# Patient Record
Sex: Female | Born: 1975 | Race: Black or African American | Hispanic: No | Marital: Single | Smoking: Never smoker
Health system: Southern US, Community
[De-identification: ages and names within clinical notes are randomized; demographics above are authoritative.]

## PROBLEM LIST (undated history)

## (undated) DIAGNOSIS — E119 Type 2 diabetes mellitus without complications: Secondary | ICD-10-CM

---

## 2014-01-21 ENCOUNTER — Encounter (HOSPITAL_COMMUNITY): Payer: Self-pay | Admitting: Emergency Medicine

## 2014-01-21 ENCOUNTER — Emergency Department (HOSPITAL_COMMUNITY)
Admission: EM | Admit: 2014-01-21 | Discharge: 2014-01-21 | Disposition: A | Discharge status: Home / Self Care | Payer: Medicaid Other | Attending: Emergency Medicine | Admitting: Emergency Medicine

## 2014-01-21 DIAGNOSIS — Z79899 Other long term (current) drug therapy: Secondary | ICD-10-CM | POA: Insufficient documentation

## 2014-01-21 DIAGNOSIS — Z880 Allergy status to penicillin: Secondary | ICD-10-CM | POA: Insufficient documentation

## 2014-01-21 DIAGNOSIS — E119 Type 2 diabetes mellitus without complications: Secondary | ICD-10-CM | POA: Insufficient documentation

## 2014-01-21 DIAGNOSIS — N390 Urinary tract infection, site not specified: Secondary | ICD-10-CM | POA: Insufficient documentation

## 2014-01-21 DIAGNOSIS — R739 Hyperglycemia, unspecified: Secondary | ICD-10-CM

## 2014-01-21 HISTORY — DX: Type 2 diabetes mellitus without complications: E11.9

## 2014-01-21 LAB — BASIC METABOLIC PANEL
Anion gap: 12 (ref 5–15)
BUN: 7 mg/dL (ref 6–23)
CO2: 24 mEq/L (ref 19–32)
Calcium: 9.2 mg/dL (ref 8.4–10.5)
Chloride: 100 mEq/L (ref 96–112)
Creatinine, Ser: 0.57 mg/dL (ref 0.50–1.10)
GLUCOSE: 304 mg/dL — AB (ref 70–99)
Potassium: 4 mEq/L (ref 3.7–5.3)
Sodium: 136 mEq/L — ABNORMAL LOW (ref 137–147)

## 2014-01-21 LAB — CBG MONITORING, ED
GLUCOSE-CAPILLARY: 297 mg/dL — AB (ref 70–99)
Glucose-Capillary: 277 mg/dL — ABNORMAL HIGH (ref 70–99)

## 2014-01-21 LAB — CBC WITH DIFFERENTIAL/PLATELET
BASOS PCT: 0 % (ref 0–1)
Basophils Absolute: 0 10*3/uL (ref 0.0–0.1)
EOS ABS: 0.1 10*3/uL (ref 0.0–0.7)
EOS PCT: 2 % (ref 0–5)
HCT: 37.7 % (ref 36.0–46.0)
HEMOGLOBIN: 12.5 g/dL (ref 12.0–15.0)
Lymphocytes Relative: 34 % (ref 12–46)
Lymphs Abs: 2.3 10*3/uL (ref 0.7–4.0)
MCH: 25 pg — AB (ref 26.0–34.0)
MCHC: 33.2 g/dL (ref 30.0–36.0)
MCV: 75.2 fL — AB (ref 78.0–100.0)
MONOS PCT: 7 % (ref 3–12)
Monocytes Absolute: 0.5 10*3/uL (ref 0.1–1.0)
Neutro Abs: 4 10*3/uL (ref 1.7–7.7)
Neutrophils Relative %: 57 % (ref 43–77)
PLATELETS: 286 10*3/uL (ref 150–400)
RBC: 5.01 MIL/uL (ref 3.87–5.11)
RDW: 13.4 % (ref 11.5–15.5)
WBC: 6.9 10*3/uL (ref 4.0–10.5)

## 2014-01-21 LAB — URINE MICROSCOPIC-ADD ON

## 2014-01-21 LAB — URINALYSIS, ROUTINE W REFLEX MICROSCOPIC
BILIRUBIN URINE: NEGATIVE
HGB URINE DIPSTICK: NEGATIVE
KETONES UR: 15 mg/dL — AB
Nitrite: NEGATIVE
PROTEIN: NEGATIVE mg/dL
Specific Gravity, Urine: 1.016 (ref 1.005–1.030)
UROBILINOGEN UA: 0.2 mg/dL (ref 0.0–1.0)
pH: 5.5 (ref 5.0–8.0)

## 2014-01-21 MED ORDER — METFORMIN HCL 500 MG PO TABS
500.0000 mg | ORAL_TABLET | Freq: Once | ORAL | Status: AC
  Administered 2014-01-21: 500 mg via ORAL
  Filled 2014-01-21: qty 1

## 2014-01-21 MED ORDER — BLOOD GLUCOSE METER KIT: PACK | Status: AC

## 2014-01-21 MED ORDER — SODIUM CHLORIDE 0.9 % IV BOLUS (SEPSIS)
1000.0000 mL | Freq: Once | INTRAVENOUS | Status: AC
  Administered 2014-01-21: 1000 mL via INTRAVENOUS

## 2014-01-21 MED ORDER — CIPROFLOXACIN HCL 500 MG PO TABS
500.0000 mg | ORAL_TABLET | Freq: Once | ORAL | Status: AC
  Administered 2014-01-21: 500 mg via ORAL
  Filled 2014-01-21: qty 1

## 2014-01-21 MED ORDER — METFORMIN HCL 500 MG PO TABS: 500.0000 mg | ORAL_TABLET | Freq: Two times a day (BID) | ORAL | Status: DC

## 2014-01-21 MED ORDER — CIPROFLOXACIN HCL 500 MG PO TABS: 500.0000 mg | ORAL_TABLET | Freq: Two times a day (BID) | ORAL | Status: AC

## 2014-01-21 NOTE — ED Notes (Signed)
Pt alert, NAD, calm, interactive, resps e/u, speaking in clear complete sentences, VSS.  

## 2014-01-21 NOTE — ED Notes (Signed)
C/o continued HA, feet itching and pins & needles in bilateral feet. denies nausea or other sx at this time. Alert, NAD, calm, interactive. No changes. VSS. Using phone.

## 2014-01-21 NOTE — ED Notes (Signed)
C/o numbness in her feet and ankles

## 2014-01-21 NOTE — Discharge Instructions (Signed)
Please start on medications as prescribed.  Contact a local primary care doctor for further evaluation and treatment of your diabetes. Return to the ER for worsening condition or new concerning symptoms.   Blood Glucose Monitoring Monitoring your blood glucose (also know as blood sugar) helps you to manage your diabetes. It also helps you and your health care provider monitor your diabetes and determine how well your treatment plan is working. WHY SHOULD YOU MONITOR YOUR BLOOD GLUCOSE?  It can help you understand how food, exercise, and medicine affect your blood glucose.  It allows you to know what your blood glucose is at any given moment. You can quickly tell if you are having low blood glucose (hypoglycemia) or high blood glucose (hyperglycemia).  It can help you and your health care provider know how to adjust your medicines.  It can help you understand how to manage an illness or adjust medicine for exercise. WHEN SHOULD YOU TEST? Your health care provider will help you decide how often you should check your blood glucose. This may depend on the type of diabetes you have, your diabetes control, or the types of medicines you are taking. Be sure to write down all of your blood glucose readings so that this information can be reviewed with your health care provider. See below for examples of testing times that your health care provider may suggest. Type 1 Diabetes  Test 4 times a day if you are in good control, using an insulin pump, or perform multiple daily injections.  If your diabetes is not well controlled or if you are sick, you may need to monitor more often.  It is a good idea to also monitor:  Before and after exercise.  Between meals and 2 hours after a meal.  Occasionally between 2:00 a.m. and 3:00 a.m. Type 2 Diabetes  It can vary with each person, but generally, if you are on insulin, test 4 times a day.  If you take medicines by mouth (orally), test 2 times a  day.  If you are on a controlled diet, test once a day.  If your diabetes is not well controlled or if you are sick, you may need to monitor more often. HOW TO MONITOR YOUR BLOOD GLUCOSE Supplies Needed  Blood glucose meter.  Test strips for your meter. Each meter has its own strips. You must use the strips that go with your own meter.  A pricking needle (lancet).  A device that holds the lancet (lancing device).  A journal or log book to write down your results. Procedure  Wash your hands with soap and water. Alcohol is not preferred.  Prick the side of your finger (not the tip) with the lancet.  Gently milk the finger until a small drop of blood appears.  Follow the instructions that come with your meter for inserting the test strip, applying blood to the strip, and using your blood glucose meter. Other Areas to Get Blood for Testing Some meters allow you to use other areas of your body (other than your finger) to test your blood. These areas are called alternative sites. The most common alternative sites are:  The forearm.  The thigh.  The back area of the lower leg.  The palm of the hand. The blood flow in these areas is slower. Therefore, the blood glucose values you get may be delayed, and the numbers are different from what you would get from your fingers. Do not use alternative sites if you think  you are having hypoglycemia. Your reading will not be accurate. Always use a finger if you are having hypoglycemia. Also, if you cannot feel your lows (hypoglycemia unawareness), always use your fingers for your blood glucose checks. ADDITIONAL TIPS FOR GLUCOSE MONITORING  Do not reuse lancets.  Always carry your supplies with you.  All blood glucose meters have a 24-hour "hotline" number to call if you have questions or need help.  Adjust (calibrate) your blood glucose meter with a control solution after finishing a few boxes of strips. BLOOD GLUCOSE RECORD KEEPING It  is a good idea to keep a daily record or log of your blood glucose readings. Most glucose meters, if not all, keep your glucose records stored in the meter. Some meters come with the ability to download your records to your home computer. Keeping a record of your blood glucose readings is especially helpful if you are wanting to look for patterns. Make notes to go along with the blood glucose readings because you might forget what happened at that exact time. Keeping good records helps you and your health care provider to work together to achieve good diabetes management.  Document Released: 04/29/2003 Document Revised: 09/10/2013 Document Reviewed: 09/18/2012 [facility] Patient Information 2015 [place], [place]. This information is not intended to replace advice given to you by your health care provider. Make sure you discuss any questions you have with your health care provider.  Hyperglycemia Hyperglycemia occurs when the glucose (sugar) in your blood is too high. Hyperglycemia can happen for many reasons, but it most often happens to people who do not know they have diabetes or are not managing their diabetes properly.  CAUSES  Whether you have diabetes or not, there are other causes of hyperglycemia. Hyperglycemia can occur when you have diabetes, but it can also occur in other situations that you might not be as aware of, such as: Diabetes  If you have diabetes and are having problems controlling your blood glucose, hyperglycemia could occur because of some of the following reasons:  Not following your meal plan.  Not taking your diabetes medications or not taking it properly.  Exercising less or doing less activity than you normally do.  Being sick. Pre-diabetes  This cannot be ignored. Before people develop Type 2 diabetes, they almost always have "pre-diabetes." This is when your blood glucose levels are higher than normal, but not yet high enough to be diagnosed as diabetes. Research has  shown that some long-term damage to the body, especially the heart and circulatory system, may already be occurring during pre-diabetes. If you take action to manage your blood glucose when you have pre-diabetes, you may delay or prevent Type 2 diabetes from developing. Stress  If you have diabetes, you may be "diet" controlled or on oral medications or insulin to control your diabetes. However, you may find that your blood glucose is higher than usual in the hospital whether you have diabetes or not. This is often referred to as "stress hyperglycemia." Stress can elevate your blood glucose. This happens because of hormones put out by the body during times of stress. If stress has been the cause of your high blood glucose, it can be followed regularly by your caregiver. That way he/she can make sure your hyperglycemia does not continue to get worse or progress to diabetes. Steroids  Steroids are medications that act on the infection fighting system (immune system) to block inflammation or infection. One side effect can be a rise in blood glucose. Most  people can produce enough extra insulin to allow for this rise, but for those who cannot, steroids make blood glucose levels go even higher. It is not unusual for steroid treatments to "uncover" diabetes that is developing. It is not always possible to determine if the hyperglycemia will go away after the steroids are stopped. A special blood test called an A1c is sometimes done to determine if your blood glucose was elevated before the steroids were started. SYMPTOMS  Thirsty.  Frequent urination.  Dry mouth.  Blurred vision.  Tired or fatigue.  Weakness.  Sleepy.  Tingling in feet or leg. DIAGNOSIS  Diagnosis is made by monitoring blood glucose in one or all of the following ways:  A1c test. This is a chemical found in your blood.  Fingerstick blood glucose monitoring.  Laboratory results. TREATMENT  First, knowing the cause of the  hyperglycemia is important before the hyperglycemia can be treated. Treatment may include, but is not be limited to:  Education.  Change or adjustment in medications.  Change or adjustment in meal plan.  Treatment for an illness, infection, etc.  More frequent blood glucose monitoring.  Change in exercise plan.  Decreasing or stopping steroids.  Lifestyle changes. HOME CARE INSTRUCTIONS   Test your blood glucose as directed.  Exercise regularly. Your caregiver will give you instructions about exercise. Pre-diabetes or diabetes which comes on with stress is helped by exercising.  Eat wholesome, balanced meals. Eat often and at regular, fixed times. Your caregiver or nutritionist will give you a meal plan to guide your sugar intake.  Being at an ideal weight is important. If needed, losing as little as 10 to 15 pounds may help improve blood glucose levels. SEEK MEDICAL CARE IF:   You have questions about medicine, activity, or diet.  You continue to have symptoms (problems such as increased thirst, urination, or weight gain). SEEK IMMEDIATE MEDICAL CARE IF:   You are vomiting or have diarrhea.  Your breath smells fruity.  You are breathing faster or slower.  You are very sleepy or incoherent.  You have numbness, tingling, or pain in your feet or hands.  You have chest pain.  Your symptoms get worse even though you have been following your caregiver's orders.  If you have any other questions or concerns. Document Released: 10/20/2000 Document Revised: 07/19/2011 Document Reviewed: 08/23/2011 [facility] Patient Information 2015 [place], [place]. This information is not intended to replace advice given to you by your health care provider. Make sure you discuss any questions you have with your health care provider.  Urinary Tract Infection Urinary tract infections (UTIs) can develop anywhere along your urinary tract. Your urinary tract is your body's drainage system for  removing wastes and extra water. Your urinary tract includes two kidneys, two ureters, a bladder, and a urethra. Your kidneys are a pair of bean-shaped organs. Each kidney is about the size of your fist. They are located below your ribs, one on each side of your spine. CAUSES Infections are caused by microbes, which are microscopic organisms, including fungi, viruses, and bacteria. These organisms are so small that they can only be seen through a microscope. Bacteria are the microbes that most commonly cause UTIs. SYMPTOMS  Symptoms of UTIs may vary by age and gender of the patient and by the location of the infection. Symptoms in young women typically include a frequent and intense urge to urinate and a painful, burning feeling in the bladder or urethra during urination. Older women and men are  more likely to be tired, shaky, and weak and have muscle aches and abdominal pain. A fever may mean the infection is in your kidneys. Other symptoms of a kidney infection include pain in your back or sides below the ribs, nausea, and vomiting. DIAGNOSIS To diagnose a UTI, your caregiver will ask you about your symptoms. Your caregiver also will ask to provide a urine sample. The urine sample will be tested for bacteria and white blood cells. White blood cells are made by your body to help fight infection. TREATMENT  Typically, UTIs can be treated with medication. Because most UTIs are caused by a bacterial infection, they usually can be treated with the use of antibiotics. The choice of antibiotic and length of treatment depend on your symptoms and the type of bacteria causing your infection. HOME CARE INSTRUCTIONS  If you were prescribed antibiotics, take them exactly as your caregiver instructs you. Finish the medication even if you feel better after you have only taken some of the medication.  Drink enough water and fluids to keep your urine clear or pale yellow.  Avoid caffeine, tea, and carbonated  beverages. They tend to irritate your bladder.  Empty your bladder often. Avoid holding urine for long periods of time.  Empty your bladder before and after sexual intercourse.  After a bowel movement, women should cleanse from front to back. Use each tissue only once. SEEK MEDICAL CARE IF:   You have back pain.  You develop a fever.  Your symptoms do not begin to resolve within 3 days. SEEK IMMEDIATE MEDICAL CARE IF:   You have severe back pain or lower abdominal pain.  You develop chills.  You have nausea or vomiting.  You have continued burning or discomfort with urination. MAKE SURE YOU:   Understand these instructions.  Will watch your condition.  Will get help right away if you are not doing well or get worse. Document Released: 02/03/2005 Document Revised: 10/26/2011 Document Reviewed: 06/04/2011 [facility] Patient Information 2015 [place], [place]. This information is not intended to replace advice given to you by your health care provider. Make sure you discuss any questions you have with your health care provider.     [facility] Department Resource Guide 1) Find a Doctor and Pay Out of Pocket Although you won't have to find out who is covered by your insurance plan, it is a good idea to ask around and get recommendations. You will then need to call the office and see if the doctor you have chosen will accept you as a new patient and what types of options they offer for patients who are self-pay. Some doctors offer discounts or will set up payment plans for their patients who do not have insurance, but you will need to ask so you aren't surprised when you get to your appointment.  2) Contact Your Local Health Department Not all health departments have doctors that can see patients for sick visits, but many do, so it is worth a call to see if yours does. If you don't know where your local health department is, you can check in your phone book. The CDC also has a  tool to help you locate your state's health department, and many state websites also have listings of all of their local health departments.  3) Find a Walk-in Clinic If your illness is not likely to be very severe or complicated, you may want to try a walk in clinic. These are popping up all over the country in  pharmacies, drugstores, and shopping centers. They're usually staffed by nurse practitioners or physician assistants that have been trained to treat common illnesses and complaints. They're usually fairly quick and inexpensive. However, if you have serious medical issues or chronic medical problems, these are probably not your best option.  No Primary Care Doctor: - Call Health Connect at  [phone] - they can help you locate a primary care doctor that  accepts your insurance, provides certain services, etc. - Physician Referral Service- [phone]  Chronic Pain Problems: Organization         Address  Phone   Notes  [facility]  [phone] Patients need to be referred by their primary care doctor.   Medication Assistance: Organization         Address  Phone   Notes  [facility] Medication [facility] [address] [place] [place], [place] [address] [phone] --Must be a resident of [facility] -- Must have NO insurance coverage whatsoever (no Medicaid/ Medicare, etc.) -- The pt. MUST have a primary care doctor that directs their care regularly and follows them in the community   [facility]  [phone]   [employer]  [phone]    Agencies that provide inexpensive medical care: Organization         Address  Phone   Notes  [facility]  [phone]   [facility]    [phone]   [facility] [address] [place], [place] [address] [phone]   [facility] [address] [place] [phone]   Planned Parenthood    [phone]    [facility]    [phone]   [facility]  [address], [place] Phone:  [phone], Fax:  [phone] Hours of Operation:  9 am - 6 pm, M-F.  Also accepts Medicaid/Medicare and self-pay.  [facility]  [address], [place] Phone: [phone], Fax: [phone]. Hours of Operation:  8:30 am - 5:30 pm, M-F.  Also accepts Medicaid and self-pay.  [facility] [place] [address] [place] Phone: [phone]   [facility] [address] Natasha Bence [place], [place] [phone], Ext. [phone] Mondays & Thursdays: 7-9 AM.  First 15 patients are seen on a first come, first serve basis.    Medicaid-accepting [facility] Providers:  Organization         Address  Phone   Notes  [facility] [address], [place] [phone] Also accepts self-pay patients.  [facility] [address] Laurell Josephs [place], [place]  [phone]   [facility] [address], [place] [phone]   [facility] [address], [place] [phone]   Renaye Rakers [address], [place]   [phone] Only accepts [place] Access [place] patients after they have their name applied to their card.   Self-Pay (no insurance) in [facility]:  Organization         Address  Phone   Notes  Sickle Cell Patients, [facility] [address] [place], [place] [phone]   [facility] [address] [place], [place] [phone]   [facility] [place]  [address], [place] (  [phone]   [facility]/Dr. Osei-Bonsu  [address], [place] or [address], [place] [phone] Phone number for both [place] and [place] locations is the same.  [facility] [address], [place] [phone]   [facility] [address] [place] or [address] [phone]   [facility] [address] [place] [phone], phone; [phone], fax Sees patients 1st and 3rd Saturday of every month.  Must not qualify for public or private insurance (i.e. Medicaid, Medicare, [place] Health Choice, Veterans' Benefits)  Household income should be no more than 200% of the poverty level The clinic cannot treat you if you are pregnant or think you are pregnant  Sexually transmitted diseases are not treated at the clinic.    Dental Care: Organization         Address  Phone  Notes  [facility] Department of [facility] [address] [place], [place] [phone] Accepts children up to age 71 who are enrolled in [place] or [place] Health Choice; pregnant women with a Medicaid card; and children who have applied for Medicaid or [place] Health Choice, but were declined, whose parents can pay a reduced fee at time of service.  [facility] Department of [facility]  [address], [place] [phone] Accepts children up to age 72 who are enrolled in [place] or [place] Health Choice; pregnant women with a Medicaid card; and children who have applied for Medicaid or [place] Health Choice, but were declined, whose parents can pay a reduced fee at time of service.  [facility]  [address] [place], [place] [phone] Patients are seen by appointment only. Walk-ins are not accepted. [facility] will see patients 65 years of age and older. Monday - Tuesday (8am-5pm) Most Wednesdays (8:30-5pm) $30 per visit, cash only  [facility]  [address], [facility] [phone] Patients are seen by appointment only. Walk-ins are not accepted. [facility] will see patients 31 years of age and older. One Wednesday  Evening (Monthly: Volunteer Based).  $30 per visit, cash only  [employer]  [phone] for adults; Children under age 29, call Graduate Pediatric Dentistry at [phone]. Children aged 69-14, please call [phone] to request a pediatric application.  Dental services are provided in all areas of dental care including fillings, crowns and bridges, complete and partial dentures, implants, gum treatment, root canals, and extractions. Preventive care is also provided. Treatment is provided to both adults and children. Patients are selected via a lottery and there is often a waiting list.   [facility] [address] [place]  [phone] [email or web address]   [facility] [address] [place], [place] [phone], Ext. [phone] Second and Fourth Thursday of each month, opens at 6:30 AM; Clinic ends at 9 AM.  Patients are seen on a first-come first-served basis, and a limited number are seen during each clinic.   [facility]  [address] Ether Griffins [place], [place] [phone]   Eligibility Requirements You must have lived in [place], [place], or [place] counties for at least the last three months.   You cannot be eligible for state or federal sponsored [place], including [employer], [place], or [employer].   You generally cannot be  eligible for healthcare insurance through your employer.    How to apply: Eligibility screenings are held every Tuesday and Wednesday afternoon from 1:00 pm until 4:00 pm. You do not need an appointment for the interview!  [facility] [address] [place], [place] [phone]   [facility] Health Department  [phone]   [facility] Health Department  [phone]   [facility] Health Department  [phone]    Behavioral Health Resources in the Community: Intensive Outpatient Programs Organization         Address  Phone  Notes  [facility] [address] [place], [place] [phone]   [facility] Outpatient [address] [place], [place] [phone]   [facility] [address] [place], [place]  [phone]   [facility] [address],  [place], [place] [phone] or [phone]   Substance Abuse Resources Organization         Address  Phone  Notes  [facility]  [phone]   [facility]  [phone]   [facility] [place]  [phone]   Floydene Flock  [phone]   Residential & Outpatient Substance Abuse Program  [phone]   Psychological Services Organization         Address  Phone  Notes  [facility] Health  [phone]   [facility]  [phone]   [facility] [address], [place] [phone] or [phone]    Mobile Crisis Teams Organization         Address  Phone  Notes  [facility], Mobile Crisis Care Unit  [phone]   [facility]  [address] [place], [place] [phone]   Doristine Locks [address] [place] [phone]    Self-Help/Support Groups Organization         Address  Phone             Notes  [facility] [place] - variety of support groups  [phone]- I7437963 Call for more information  Narcotics Anonymous (NA), [facility] [address], [employer] [place]  2 meetings at this location   Statistician         Address  Phone  Notes  [facility] [address],    [place]  [phone]   [facility]  [address] [place] [address], [place], [place] [phone]   [facility] [address] [place], [place] [phone] Admissions: 8am-3pm M-F  [facility] [address]    [place], [place] [phone]   [facility] [address] [place],  [place], [place] [phone]   [facility] [address],  [place], [place] [phone]   [facility] Intensive Outpatient [address], [place], [place] [phone]   [facility] [address]  [place], [place] [phone] or [phone]   [facility] [address] [place], [place] [phone] Accepts Medicaid  [facility] [address]  [place] [phone] Substance Abuse/Addiction Treatment   [facility]         Address  Phone  Notes  [facility]  [phone]   Angie Fava, PhD [address] [place], [place]   [phone] or [phone]   [facility]   [address]  [place], [place] [phone](336) [phone]   [facility] [address], [place], [place] [phone]) [phone] Insurance/Medicaid/sponsorship through [employer]  [facility] [address]                                    [place], [place] [phone](336) [phone] Therapy/tele-psych/case  [facility] [address]   [place], [place] [phone]) [phone]    Dr. Lolly MustacheArfeen  [phone]) [phone]   [facility]  [employer] [facility] 1) [address], [place] 2) [address], [place] 3)  [address], [place] [phone]) [phone]) [phone]  [phone]) [phone]   [facility] Hot[facility] [phone]) [phone] or [phone](336) [phone] (After Hours)

## 2014-01-21 NOTE — ED Provider Notes (Signed)
CSN: [id number]     Arrival date & time 01/21/14  0244 History   First MD Initiated Contact with Patient 01/21/14 0403     Chief Complaint  Patient presents with  . Hyperglycemia     (Consider location/radiation/quality/duration/timing/severity/associated sxs/prior Treatment) HPI 38 year old female presents to emergency room from home with complaint of thirst, headache, numbness in feet ankles, increased urination, dizziness.  Patient reports history of diabetes, had been on metformin in the past.  She reports she had worsening diabetes with her last pregnancy, and was placed on insulin.  Patient reports it has been some time since she has had any diabetic medication. Patient does not have insurance and does not have a doctor.  She recently moved to the area.  She does not have a primary care Dr. she denies any fever or chills no nausea vomiting or diarrhea.  She denies any vaginal discharge.   Past Medical History  Diagnosis Date  . Diabetes mellitus without complication    History reviewed. No pertinent past surgical history. No family history on file. History  Substance Use Topics  . Smoking status: Never Smoker   . Smokeless tobacco: Not on file  . Alcohol Use: No   OB History   Grav Para Term Preterm Abortions TAB SAB Ect Mult Living                 Review of Systems   See History of Present Illness; otherwise all other systems are reviewed and negative  Allergies  Penicillins  Home Medications   Prior to Admission medications   Medication Sig Start Date End Date Taking? Authorizing Provider  acetaminophen (TYLENOL) 500 MG tablet Take 1,000 mg by mouth every 6 (six) hours as needed for mild pain.   Yes Historical Provider, MD  Multiple Vitamins-Calcium (ONE-A-DAY WOMENS PO) Take 1 tablet by mouth daily.   Yes Historical Provider, MD   BP 111/73  Pulse 77  Temp(Src) 98.6 F (37 C) (Oral)  Resp 18  SpO2 100%  LMP 01/17/2014 Physical Exam  Nursing note and vitals  reviewed. Constitutional: She is oriented to person, place, and time. She appears well-developed and well-nourished.  HENT:  Head: Normocephalic and atraumatic.  Nose: Nose normal.  Mouth/Throat: Oropharynx is clear and moist.  Eyes: Conjunctivae and EOM are normal. Pupils are equal, round, and reactive to light.  Neck: Normal range of motion. Neck supple. No JVD present. No tracheal deviation present. No thyromegaly present.  Cardiovascular: Normal rate, regular rhythm, normal heart sounds and intact distal pulses.  Exam reveals no gallop and no friction rub.   No murmur heard. Pulmonary/Chest: Effort normal and breath sounds normal. No stridor. No respiratory distress. She has no wheezes. She has no rales. She exhibits no tenderness.  Abdominal: Soft. Bowel sounds are normal. She exhibits no distension and no mass. There is no tenderness. There is no rebound and no guarding.  Musculoskeletal: Normal range of motion. She exhibits no edema and no tenderness.  Lymphadenopathy:    She has no cervical adenopathy.  Neurological: She is alert and oriented to person, place, and time. She displays normal reflexes. She exhibits normal muscle tone. Coordination normal.  Skin: Skin is warm and dry. No rash noted. No erythema. No pallor.  Psychiatric: She has a normal mood and affect. Her behavior is normal. Judgment and thought content normal.    ED Course  Procedures (including critical care time) Labs Review Labs Reviewed  BASIC METABOLIC PANEL - Abnormal; Notable for the  following:    Sodium 136 (*)    Glucose, Bld 304 (*)    All other components within normal limits  CBC WITH DIFFERENTIAL - Abnormal; Notable for the following:    MCV 75.2 (*)    MCH 25.0 (*)    All other components within normal limits  URINALYSIS, ROUTINE W REFLEX MICROSCOPIC - Abnormal; Notable for the following:    Glucose, UA >1000 (*)    Ketones, ur 15 (*)    Leukocytes, UA MODERATE (*)    All other components  within normal limits  URINE MICROSCOPIC-ADD ON - Abnormal; Notable for the following:    Squamous Epithelial / LPF FEW (*)    Bacteria, UA MANY (*)    All other components within normal limits  CBG MONITORING, ED - Abnormal; Notable for the following:    Glucose-Capillary 277 (*)    All other components within normal limits  CBG MONITORING, ED - Abnormal; Notable for the following:    Glucose-Capillary 297 (*)    All other components within normal limits    Imaging Review No results found.   EKG Interpretation None      MDM   Final diagnoses:  Hyperglycemia  UTI (lower urinary tract infection)    38 year old female with hyperglycemia and known diabetes not on a treatment.  It also appears that she has a urinary tract infection.  Will treat for both.  Plan for patient received IV fluids, Cipro.  Patient counseled that she will need close followup with a local primary care Dr. for treatment of her diabetes.    Olivia Mackie, MD 01/21/14 [phone]

## 2014-01-21 NOTE — ED Notes (Signed)
The pt is c/o a headache and high bp for 2 days now with nv.  lmp 4 days ago. She also has high blood sugar

## 2014-01-21 NOTE — ED Notes (Signed)
Alert, NAD, calm, interactive, resps e/u, speaking in clear complete sentences, c/o HA, also admits to dizziness, sob, nausea, blurred vision, thirst and frequency. Ambulatory to b/r with steady gait.

## 2014-06-22 ENCOUNTER — Emergency Department (HOSPITAL_COMMUNITY)
Admission: EM | Admit: 2014-06-22 | Discharge: 2014-06-22 | Disposition: A | Discharge status: Home / Self Care | Payer: Medicaid Other | Attending: Emergency Medicine | Admitting: Emergency Medicine

## 2014-06-22 ENCOUNTER — Emergency Department (HOSPITAL_COMMUNITY): Payer: Medicaid Other

## 2014-06-22 ENCOUNTER — Encounter (HOSPITAL_COMMUNITY): Payer: Self-pay | Admitting: Emergency Medicine

## 2014-06-22 DIAGNOSIS — Z794 Long term (current) use of insulin: Secondary | ICD-10-CM | POA: Insufficient documentation

## 2014-06-22 DIAGNOSIS — E1165 Type 2 diabetes mellitus with hyperglycemia: Secondary | ICD-10-CM | POA: Insufficient documentation

## 2014-06-22 DIAGNOSIS — K8020 Calculus of gallbladder without cholecystitis without obstruction: Secondary | ICD-10-CM

## 2014-06-22 DIAGNOSIS — Z880 Allergy status to penicillin: Secondary | ICD-10-CM | POA: Insufficient documentation

## 2014-06-22 DIAGNOSIS — Z3202 Encounter for pregnancy test, result negative: Secondary | ICD-10-CM | POA: Insufficient documentation

## 2014-06-22 DIAGNOSIS — R0789 Other chest pain: Secondary | ICD-10-CM | POA: Insufficient documentation

## 2014-06-22 DIAGNOSIS — M545 Low back pain, unspecified: Secondary | ICD-10-CM

## 2014-06-22 DIAGNOSIS — R739 Hyperglycemia, unspecified: Secondary | ICD-10-CM

## 2014-06-22 DIAGNOSIS — R079 Chest pain, unspecified: Secondary | ICD-10-CM

## 2014-06-22 DIAGNOSIS — R197 Diarrhea, unspecified: Secondary | ICD-10-CM

## 2014-06-22 DIAGNOSIS — R112 Nausea with vomiting, unspecified: Secondary | ICD-10-CM

## 2014-06-22 DIAGNOSIS — N760 Acute vaginitis: Secondary | ICD-10-CM | POA: Insufficient documentation

## 2014-06-22 DIAGNOSIS — B9689 Other specified bacterial agents as the cause of diseases classified elsewhere: Secondary | ICD-10-CM

## 2014-06-22 DIAGNOSIS — N730 Acute parametritis and pelvic cellulitis: Secondary | ICD-10-CM

## 2014-06-22 LAB — I-STAT TROPONIN, ED: TROPONIN I, POC: 0 ng/mL (ref 0.00–0.08)

## 2014-06-22 LAB — URINE MICROSCOPIC-ADD ON

## 2014-06-22 LAB — URINALYSIS, ROUTINE W REFLEX MICROSCOPIC
Bilirubin Urine: NEGATIVE
Glucose, UA: >1000 mg/dL — AB
Hgb urine dipstick: NEGATIVE
KETONES UR: NEGATIVE mg/dL
LEUKOCYTES UA: NEGATIVE
Nitrite: NEGATIVE
PROTEIN: NEGATIVE mg/dL
Specific Gravity, Urine: 1.024 (ref 1.005–1.030)
UROBILINOGEN UA: 0.2 mg/dL (ref 0.0–1.0)
pH: 6.5 (ref 5.0–8.0)

## 2014-06-22 LAB — BASIC METABOLIC PANEL
ANION GAP: 11 (ref 5–15)
BUN: 8 mg/dL (ref 6–23)
CHLORIDE: 98 mmol/L (ref 96–112)
CO2: 23 mmol/L (ref 19–32)
CREATININE: 0.71 mg/dL (ref 0.50–1.10)
Calcium: 9.4 mg/dL (ref 8.4–10.5)
GFR calc Af Amer: >90 mL/min (ref >90)
GFR calc non Af Amer: >90 mL/min (ref >90)
Glucose, Bld: 517 mg/dL — ABNORMAL HIGH (ref 70–99)
POTASSIUM: 3.8 mmol/L (ref 3.5–5.1)
SODIUM: 132 mmol/L — AB (ref 135–145)

## 2014-06-22 LAB — CBG MONITORING, ED: Glucose-Capillary: 341 mg/dL — ABNORMAL HIGH (ref 70–99)

## 2014-06-22 LAB — WET PREP, GENITAL: Trich, Wet Prep: NONE SEEN

## 2014-06-22 LAB — CBC
HCT: 39.7 % (ref 36.0–46.0)
Hemoglobin: 13.2 g/dL (ref 12.0–15.0)
MCH: 25.5 pg — ABNORMAL LOW (ref 26.0–34.0)
MCHC: 33.2 g/dL (ref 30.0–36.0)
MCV: 76.8 fL — ABNORMAL LOW (ref 78.0–100.0)
Platelets: 256 10*3/uL (ref 150–400)
RBC: 5.17 MIL/uL — AB (ref 3.87–5.11)
RDW: 13.2 % (ref 11.5–15.5)
WBC: 5.8 10*3/uL (ref 4.0–10.5)

## 2014-06-22 LAB — HEPATIC FUNCTION PANEL
ALT: 20 U/L (ref 0–35)
AST: 16 U/L (ref 0–37)
Albumin: 3.9 g/dL (ref 3.5–5.2)
Alkaline Phosphatase: 74 U/L (ref 39–117)
TOTAL PROTEIN: 7.1 g/dL (ref 6.0–8.3)
Total Bilirubin: 0.7 mg/dL (ref 0.3–1.2)

## 2014-06-22 LAB — POC URINE PREG, ED: PREG TEST UR: NEGATIVE

## 2014-06-22 MED ORDER — SODIUM CHLORIDE 0.9 % IV BOLUS (SEPSIS)
1000.0000 mL | Freq: Once | INTRAVENOUS | Status: AC
  Administered 2014-06-22: 1000 mL via INTRAVENOUS

## 2014-06-22 MED ORDER — ONDANSETRON HCL 4 MG/2ML IJ SOLN
4.0000 mg | Freq: Once | INTRAMUSCULAR | Status: AC
  Administered 2014-06-22: 4 mg via INTRAVENOUS
  Filled 2014-06-22: qty 2

## 2014-06-22 MED ORDER — LIDOCAINE HCL (PF) 1 % IJ SOLN: 0.9000 mL | Freq: Once | INTRAMUSCULAR | Status: DC

## 2014-06-22 MED ORDER — DOXYCYCLINE HYCLATE 100 MG PO CAPS: 100.0000 mg | ORAL_CAPSULE | Freq: Two times a day (BID) | ORAL | Status: AC

## 2014-06-22 MED ORDER — METFORMIN HCL 500 MG PO TABS: 500.0000 mg | ORAL_TABLET | Freq: Two times a day (BID) | ORAL | Status: AC

## 2014-06-22 MED ORDER — LIDOCAINE HCL (PF) 1 % IJ SOLN
INTRAMUSCULAR | Status: AC
Start: 2014-06-22 — End: 2014-06-22
  Filled 2014-06-22: qty 5

## 2014-06-22 MED ORDER — LIDOCAINE HCL (PF) 1 % IJ SOLN: 2.0000 mL | Freq: Once | INTRAMUSCULAR | Status: DC

## 2014-06-22 MED ORDER — CEFTRIAXONE SODIUM 250 MG IJ SOLR
250.0000 mg | Freq: Once | INTRAMUSCULAR | Status: AC
  Administered 2014-06-22: 250 mg via INTRAMUSCULAR
  Filled 2014-06-22: qty 250

## 2014-06-22 MED ORDER — METRONIDAZOLE 500 MG PO TABS: 500.0000 mg | ORAL_TABLET | Freq: Two times a day (BID) | ORAL | Status: AC

## 2014-06-22 MED ORDER — LIDOCAINE HCL (PF) 1 % IJ SOLN
0.9000 mL | Freq: Once | INTRAMUSCULAR | Status: AC
  Administered 2014-06-22: 0.9 mL via INTRADERMAL

## 2014-06-22 MED ORDER — FLUCONAZOLE 100 MG PO TABS
150.0000 mg | ORAL_TABLET | Freq: Once | ORAL | Status: AC
  Administered 2014-06-22: 150 mg via ORAL
  Filled 2014-06-22: qty 2

## 2014-06-22 MED ORDER — AZITHROMYCIN 250 MG PO TABS
1000.0000 mg | ORAL_TABLET | Freq: Once | ORAL | Status: AC
  Administered 2014-06-22: 1000 mg via ORAL
  Filled 2014-06-22: qty 4

## 2014-06-22 MED ORDER — MORPHINE SULFATE 4 MG/ML IJ SOLN
4.0000 mg | Freq: Once | INTRAMUSCULAR | Status: AC
  Administered 2014-06-22: 4 mg via INTRAVENOUS
  Filled 2014-06-22: qty 1

## 2014-06-22 NOTE — ED Notes (Signed)
Patient also stated that she has been having a vaginal discharge that has a foul odor.  Discharge started 3 days ago

## 2014-06-22 NOTE — ED Provider Notes (Signed)
CSN: [id number]     Arrival date & time 06/22/14  0411 History   First MD Initiated Contact with Patient 06/22/14 0600     Chief Complaint  Patient presents with  . Chest Pain  . Abdominal Pain  . Back Pain     (Consider location/radiation/quality/duration/timing/severity/associated sxs/prior Treatment) HPI  Lauren Marquez is a 39 y.o. female with PMH of diabetes presenting with chest, abdominal, low back pain that started yesterday. Pain is described as a soreness and comes and goes. She also endorses 3 episodes of nausea, vomiting and diarrhea. No blood in emesis or stool. Patient denies any fevers, chills. She has had cesareans. But no other abdominal surgeries. Patient also with foul-smelling to her urine but no other urinary symptoms. Patient denied to me increase in vaginal discharge or foul odor. Last menstrual period 2 weeks ago. Patient also with chest pain has been persistent. She's never had chest pain before. It is worse with sitting forward and movement. She does not have a cardiac history. She denies hemoptysis, DVT, PE, recent surgery, trauma, malignancy, estrogen use. No history of hypertension, hypercholesterolemia. Patient doesn't diabetes. No family history of sudden cardiac death. Patient also with back pain no urinary or bowel incontinence. Patient has diabetes and does have numbness in her distal toes that is been there for years. No new neurological symptoms.   Past Medical History  Diagnosis Date  . Diabetes mellitus without complication    History reviewed. No pertinent past surgical history. No family history on file. History  Substance Use Topics  . Smoking status: Never Smoker   . Smokeless tobacco: Not on file  . Alcohol Use: No   OB History    No data available     Review of Systems 10 Systems reviewed and are negative for acute change except as noted in the HPI.    Allergies  Penicillins  Home Medications   Prior to Admission medications    Medication Sig Start Date End Date Taking? Authorizing Provider  insulin detemir (LEVEMIR) 100 UNIT/ML injection Inject 20 Units into the skin daily.   Yes Historical Provider, MD  insulin lispro (HUMALOG) 100 UNIT/ML injection Inject 5 Units into the skin 3 (three) times daily before meals.   Yes Historical Provider, MD  acetaminophen (TYLENOL) 500 MG tablet Take 1,000 mg by mouth every 6 (six) hours as needed for mild pain.    Historical Provider, MD  Blood Glucose Monitoring Suppl (BLOOD GLUCOSE METER KIT AND SUPPLIES) Dispense based on patient and insurance preference. Use up to four times daily as directed. (FOR ICD-9 250.00, 250.01). 01/21/14   Kalman Drape, MD  ciprofloxacin (CIPRO) 500 MG tablet Take 1 tablet (500 mg total) by mouth 2 (two) times daily. Patient not taking: Reported on 06/22/2014 01/21/14   Kalman Drape, MD  doxycycline (VIBRAMYCIN) 100 MG capsule Take 1 capsule (100 mg total) by mouth 2 (two) times daily. 06/22/14   Pura Spice, PA-C  metFORMIN (GLUCOPHAGE) 500 MG tablet Take 1 tablet (500 mg total) by mouth 2 (two) times daily with a meal. 06/22/14   Pura Spice, PA-C  metroNIDAZOLE (FLAGYL) 500 MG tablet Take 1 tablet (500 mg total) by mouth 2 (two) times daily. 06/22/14   Pura Spice, PA-C  Multiple Vitamins-Calcium (ONE-A-DAY WOMENS PO) Take 1 tablet by mouth daily.    Historical Provider, MD   BP 122/64 mmHg  Pulse 72  Temp(Src) 98.2 F (36.8 C) (Oral)  Resp 17  SpO2 100%  LMP 05/29/2014 Physical Exam  Constitutional: She appears well-developed and well-nourished. No distress.  HENT:  Head: Normocephalic and atraumatic.  Mouth/Throat: Oropharynx is clear and moist.  Eyes: Conjunctivae are normal. Right eye exhibits no discharge. Left eye exhibits no discharge.  Neck: No JVD present.  Cardiovascular: Normal rate, regular rhythm and normal heart sounds.   No leg swelling or tenderness.  Pulmonary/Chest: Effort normal and breath sounds normal. No  respiratory distress. She has no wheezes.  Abdominal: Soft. Bowel sounds are normal. She exhibits no distension.  Hyperactive bowel sounds. Suprapubic abdominal tenderness without rebound, rigidity, guarding. No right upper quadrant abdominal tenderness and negative Murphy sign.  Genitourinary:  Left lower labia with erythema and swelling but no abscess. Tenderness to area. Cervix erythematous without lesions. Os closed. CMT without right or left adnexal tenderness. Moderate white opaque thick discharge with foul odor. Nurse tech in room for exam.  Musculoskeletal:  No midline back tenderness, step off or crepitus. Right and left sided lower back tenderness. No CVA tenderness.  Neurological: She is alert. Coordination normal.  Equal muscle tone. 5/5 strength in lower extremities. DTR equal and intact. Negative straight leg test. Normal gait.  Skin: Skin is warm and dry. She is not diaphoretic.  Nursing note and vitals reviewed.   ED Course  Procedures (including critical care time) Labs Review Labs Reviewed  WET PREP, GENITAL - Abnormal; Notable for the following:    Yeast Wet Prep HPF POC RARE (*)    Clue Cells Wet Prep HPF POC TOO NUMEROUS TO COUNT (*)    WBC, Wet Prep HPF POC MODERATE (*)    All other components within normal limits  CBC - Abnormal; Notable for the following:    RBC 5.17 (*)    MCV 76.8 (*)    MCH 25.5 (*)    All other components within normal limits  BASIC METABOLIC PANEL - Abnormal; Notable for the following:    Sodium 132 (*)    Glucose, Bld 517 (*)    All other components within normal limits  URINALYSIS, ROUTINE W REFLEX MICROSCOPIC - Abnormal; Notable for the following:    Glucose, UA >1000 (*)    All other components within normal limits  CBG MONITORING, ED - Abnormal; Notable for the following:    Glucose-Capillary 341 (*)    All other components within normal limits  URINE MICROSCOPIC-ADD ON  RPR  HIV ANTIBODY (ROUTINE TESTING)  HEPATIC FUNCTION  PANEL  I-STAT TROPOININ, ED  POC URINE PREG, ED  GC/CHLAMYDIA PROBE AMP ([place])    Imaging Review Dg Abd Acute W/chest  06/22/2014   CLINICAL DATA:  Acute onset of mid sternal chest pain and lower abdominal pain and vomiting. Diarrhea and fever. Initial encounter.  EXAM: ACUTE ABDOMEN SERIES (ABDOMEN 2 VIEW & CHEST 1 VIEW)  COMPARISON:  None.  FINDINGS: The lungs are well-aerated. Apparent scattered calcified granulomata are seen bilaterally. There is no evidence of focal opacification, pleural effusion or pneumothorax. The cardiomediastinal silhouette is within normal limits.  The visualized bowel gas pattern is unremarkable. Scattered stool and air are seen within the colon; there is no evidence of small bowel dilatation to suggest obstruction. No free intra-abdominal air is identified on the provided upright view.  A prominent 3.4 cm rounded calcification at the right mid abdomen may reflect a large gallstone.  No acute osseous abnormalities are seen; the sacroiliac joints are unremarkable in appearance.  IMPRESSION: 1. Unremarkable bowel gas pattern; no free intra-abdominal air seen.  Small to moderate amount of stool noted in the colon. 2. No acute cardiopulmonary process seen. Apparent scattered calcified granulomata noted bilaterally. 3. Possible cholelithiasis incidentally noted.   Electronically Signed   By: Garald Balding M.D.   On: 06/22/2014 07:05     EKG Interpretation   Date/Time:  Saturday June 22 2014 04:30:12 EST Ventricular Rate:  83 PR Interval:  118 QRS Duration: 66 QT Interval:  362 QTC Calculation: 425 R Axis:   77 Text Interpretation:  Normal sinus rhythm Normal ECG Confirmed by  Alvino Chapel  MD, Ovid Curd [phone]) on 06/22/2014 9:38:54 AM      MDM   Final diagnoses:  PID (acute pelvic inflammatory disease)  BV (bacterial vaginosis)  Chest pain, unspecified chest pain type  Nausea vomiting and diarrhea  Hyperglycemia  Gallstone   Pt presenting with many  complaints. Pt has nausea, vomiting, diarrhea with suprapubic abdominal pain and chest pain since yesterday. Chest pain is mid chest and constant since last night. Patient without history of CAD. Heart score of one, pt PERC negative. EKG without abnormalities and troponin negative. Patient has had over 6 hours of constant chest pain. Chest x-ray without evidence of cardiomegaly. I doubt chest pain of cardiac or pulmonary etiology. Patient to follow-up with PCP.  For abdominal pain. VSS. Pt without significant abdominal tenderness on exam. Pelvic exam with significant discomfort. Patient with CMT. No adnexal tenderness. Patient with moderate with blood cells. No leukocytosis. Patient not actively vomiting in the ED. Tolerating fluids. No fever. Pt diagnosed with PID. I doubt TOA. Patient given azithromycin and ceftriaxone and 14 day course of doxycycline as well as Flagyl. Patient instructed not to drink alcohol taking these medications. Acute abdominal series with possible cholelithiasis. Patient without upper quadrant tenderness. Negative Murphy sign. Hepatic function paneling within normal limits. I doubt cholecystitis.  Pt also with hyperglycemia with history of diabetes. Patient currently not taking anything for her DM. Refilled metformin and patient strongly encouraged to follow-up establish care with primary care provider. ED resources provided. Referral to the wellness center as well.  Discussed return precautions with patient. Discussed all results and patient verbalizes understanding and agrees with plan.     Pura Spice, PA-C 06/22/14 1120  Julianne Rice, MD 06/23/14 [phone]

## 2014-06-22 NOTE — Discharge Instructions (Signed)
Return to the emergency room with worsening of symptoms, new symptoms or with symptoms that are concerning , especially fevers, abdominal pain in one area, unable to keep down fluids, blood in stool or vomit, severe pain, you feel faint, lightheaded or pass out. Please take all of your antibiotics until finished!   You may develop abdominal discomfort or diarrhea from the antibiotic.  You may help offset this with probiotics which you can buy or get in yogurt. Do not eat  or take the probiotics until 2 hours after your antibiotic.  DO NOT DRINK ALCOHOL WHILE TAKING FLAGYL. It can cause nausea, vomiting, abdominal pain. Call the wellness center Monday morning to schedule an appointment. Tell them you were seen at the Emergency room and have uncontrolled diabetes.  If you do not have a primary care provider please call the number below under ED resources to establish care with a provider and follow up for your diabetes. It was high in the Emergency room. Take metformin for this. You will need to take this long term.   Emergency Department Resource Guide 1) Find a Doctor and Pay Out of Pocket Although you won't have to find out who is covered by your insurance plan, it is a good idea to ask around and get recommendations. You will then need to call the office and see if the doctor you have chosen will accept you as a new patient and what types of options they offer for patients who are self-pay. Some doctors offer discounts or will set up payment plans for their patients who do not have insurance, but you will need to ask so you aren't surprised when you get to your appointment.  2) Contact Your Local Health Department Not all health departments have doctors that can see patients for sick visits, but many do, so it is worth a call to see if yours does. If you don't know where your local health department is, you can check in your phone book. The CDC also has a tool to help you locate your state's health  department, and many state websites also have listings of all of their local health departments.  3) Find a Walk-in Clinic If your illness is not likely to be very severe or complicated, you may want to try a walk in clinic. These are popping up all over the country in pharmacies, drugstores, and shopping centers. They're usually staffed by nurse practitioners or physician assistants that have been trained to treat common illnesses and complaints. They're usually fairly quick and inexpensive. However, if you have serious medical issues or chronic medical problems, these are probably not your best option.  No Primary Care Doctor: - Call Health Connect at  [phone] - they can help you locate a primary care doctor that  accepts your insurance, provides certain services, etc. - Physician Referral Service- [phone]  Chronic Pain Problems: Organization         Address  Phone   Notes  [facility]  [phone] Patients need to be referred by their primary care doctor.   Medication Assistance: Organization         Address  Phone   Notes  [facility]ication [facility] [address] [place], [place] [id number] [phone] --Must be a resident of [place] -- Must have NO insurance coverage whatsoever (no Medicaid/ Medicare, etc.) -- The pt. MUST have a primary care doctor that directs their care regularly and follows them in  the community   MedAssist  [phone]   [place]  [phone]    Agencies that provide inexpensive medical care: Organization         Address  Phone   Notes  [place]  [phone]   [facility]    [phone]   [facility] [place], [place] [address] [phone]   [place] [address], [place] [phone]   Planned Parenthood    [phone]   [facility]    [phone]    [place] and [address], [place] Phone:  [phone], Fax:  [phone] Hours of Operation:  9 am - 6 pm, M-F.  Also accepts Medicaid/Medicare and self-pay.  [facility] for Rafael [address], [address], [place] Phone: [phone], Fax: [phone]. Hours of Operation:  8:30 am - 5:30 pm, M-F.  Also accepts Medicaid and self-pay.  [facility] [place] [address] [place] Phone: [phone]   [place], [place], [place] [phone], Ext. [phone] Mondays & Thursdays: 7-9 AM.  First 15 patients are seen on a first come, first serve basis.    [place] Providers:  Organization         Address  Phone   Notes  [facility] [address], [place] [phone] Also accepts self-pay patients.  [facility] [address], [place]  [phone]   [place], [address], [place] [phone]   [facility] [address] [place] [phone]   Lucianne Lei [address], [place]   [phone] Only accepts [place] Access [place] patients after they have their name applied to their card.   Self-Pay (no insurance) in [facility]:  Organization         Address  Phone   Notes  Sickle Cell Patients, [facility] [place] [phone]   [facility] [place] [phone]   [facility] [place]  [place], [place],  [phone]   [facility]/Dr. Osei-Bonsu  [address] [place] or [address], [place] [phone] Phone number for both [place] and [place] locations is the same.  Urgent Medical and [facility] [address], [place] [phone]    [facility] [address] [place] or [address] [phone]   [facility] [address] [place] [phone], phone; [phone], fax Sees patients 1st and 3rd Saturday of every month.  Must not qualify for public or private insurance (i.e. Medicaid, Medicare, [place] Health Choice, Veterans' Benefits)  Household income should be no more than 200% of the poverty level The clinic cannot treat you if you are pregnant or think you are pregnant  Sexually transmitted diseases are not treated at the clinic.    Dental Care: Organization         Address  Phone  Notes  [facility] [address] [place], [place] [phone] Accepts children up to age 70 who are enrolled in [place]  or [place] Health Choice; pregnant women with a Medicaid card; and children who have applied for Medicaid or [place] Health Choice, but were declined, whose parents can pay a reduced fee at time of service.  [facility] Department of [facility]  [address], [place] [phone] Accepts children up to age 42 who are enrolled in [place] or [place]; pregnant women with a Medicaid card; and children who have applied for Medicaid or [place] Health Choice, but were declined, whose parents can pay a reduced fee at time of service.  [facility] Access PROGRAM  [place] [phone] Patients are seen by appointment only. Walk-ins are not accepted. [place] will see patients 45 years of age and older. Monday - Tuesday (8am-5pm) Most Wednesdays (8:30-5pm) $30 per visit, cash only  [facility] Adult Dental Access PROGRAM  [address], [facility] [phone] Patients are seen by appointment only. Walk-ins are not accepted. [place] will see patients 46 years of age and older. One Wednesday Evening (Monthly: Volunteer Based).   $30 per visit, cash only  [place]  [phone] for adults; Children under age 47, call Graduate Pediatric Dentistry at [phone]. Children aged 71-14, please call [phone] to request a pediatric application.  Dental services are provided in all areas of dental care including fillings, crowns and bridges, complete and partial dentures, implants, gum treatment, root canals, and extractions. Preventive care is also provided. Treatment is provided to both adults and children. Patients are selected via a lottery and there is often a waiting list.   [facility] [address], [place]  [phone] [email or web address]   [facility] [address] [place], [place] [phone], Ext. [phone] Second and Fourth Thursday of each month, opens at 6:30 AM; Clinic ends at 9 AM.  Patients are seen on a first-come first-served basis, and a limited number are seen during each clinic.   [facility]  [address] [place], [place] [phone]   Eligibility Requirements You must have lived in [place], [place], or [place] counties for at least the last three months.   You cannot be eligible for state or federal sponsored [employer], including [employer], [place], or [employer].   You generally cannot be eligible for healthcare insurance through your employer.    How to apply: Eligibility screenings are held every Tuesday and Wednesday afternoon from 1:00 pm until 4:00 pm. You do not need an appointment for the interview!  [facility] [address] [place], [place]   [place]  [place] Department  [place]  [phone]    Behavioral Health Resources in the Community: Intensive Outpatient Programs Organization         Address  Phone  Notes  [place]. [address], [place], [place] [phone]   [facility] [address], [place], [place]   ADS: Alcohol & Drug [facility] [address] [place], [place]   [place] [address],  [place], [place] or [phone]   Substance Abuse Resources Organization         Address  Phone  Notes  Alcohol and Drug Services  [place]  [phone]   [facility] [place]  House  [phone]   Chinita Pester  [phone]   Residential & Outpatient Substance Abuse Program  [phone]   Psychological Services Organization         Address  Phone  Notes  [place]  [place]  [phone]   [place] [address] [place] or [phone]    Mobile Crisis Teams Organization         Address  Phone  Notes  [facility], Mobile Crisis Care Unit  [phone]   [facility]ive Psychotherapeutic Services  [address] [place], [place]   [place] Levels [address] [place] [phone]    Self-Help/Support Groups Organization         Address  Phone             Notes  [place]. of [place] - variety of support groups  [place] Call for more information  Narcotics Anonymous (NA), [facility] [address], [employer] [place]  2 meetings at this location   Special educational needs teacher         Address  Phone  Notes  [facility] Residential Treatment [employer],    [place]  [phone]   [facility]  [address] [place] [address], [place], [place]   [place], [place] [phone] Admissions: 8am-3pm M-F  Incentives Substance [place] [address]    [place], [place] [phone]   [facility] [address] [place], [place], [place]   [facility] [address],  [place], [place]   [facility]ight Programs - Intensive Outpatient [address] Kristeen [address], [place], [place]   [facility] ([place] [address].,  [place], [place] [phone] or [phone]   [facility] Services (RTS) [address], [place], [place] Accepts Medicaid  [facility] [place] [address]  [place] [phone] Substance Abuse/Addiction Treatment   [facility] Organization         Address  Phone  Notes  [facility]  [phone]   Domenic Schwab, PhD [address] Arlis [place], [place]   [phone] or [phone]   [place], [place] [phone]   [facility] [phone], [place], [place] [phone] Insurance/Medicaid/sponsorship through [facility] and Families [address] [place]                                    [place], [place] [phone] [place] [address] [place] [address], [place] [phone]    Dr. Adele Schilder  [phone]   [facility] Dept. 1) [address], [place] 2) [place] 3)  [place] [address], [place] [phone]  [phone]   [place] [phone] or [phone] (After Hours)

## 2014-06-22 NOTE — ED Notes (Signed)
Pt reports chest, abdomen, and back pain. Pt endorses N/V. Reports 3 episodes of emesis. Pt currently ax4, NAD.

## 2014-06-23 LAB — RPR: RPR Ser Ql: NONREACTIVE

## 2014-06-24 LAB — GC/CHLAMYDIA PROBE AMP (~~LOC~~) NOT AT ARMC
Chlamydia: NEGATIVE
Neisseria Gonorrhea: NEGATIVE

## 2014-06-24 LAB — HIV ANTIBODY (ROUTINE TESTING W REFLEX): HIV Screen 4th Generation wRfx: NONREACTIVE

## 2015-10-02 IMAGING — DX DG ABDOMEN ACUTE W/ 1V CHEST
3 series · 3 of 3 positions shown · non-contrast
Comparison: None.

CLINICAL DATA: Acute onset of mid sternal chest pain and lower
abdominal pain and vomiting. Diarrhea and fever. Initial encounter.

EXAM:
ACUTE ABDOMEN SERIES (ABDOMEN 2 VIEW & CHEST 1 VIEW)

[chest pa]
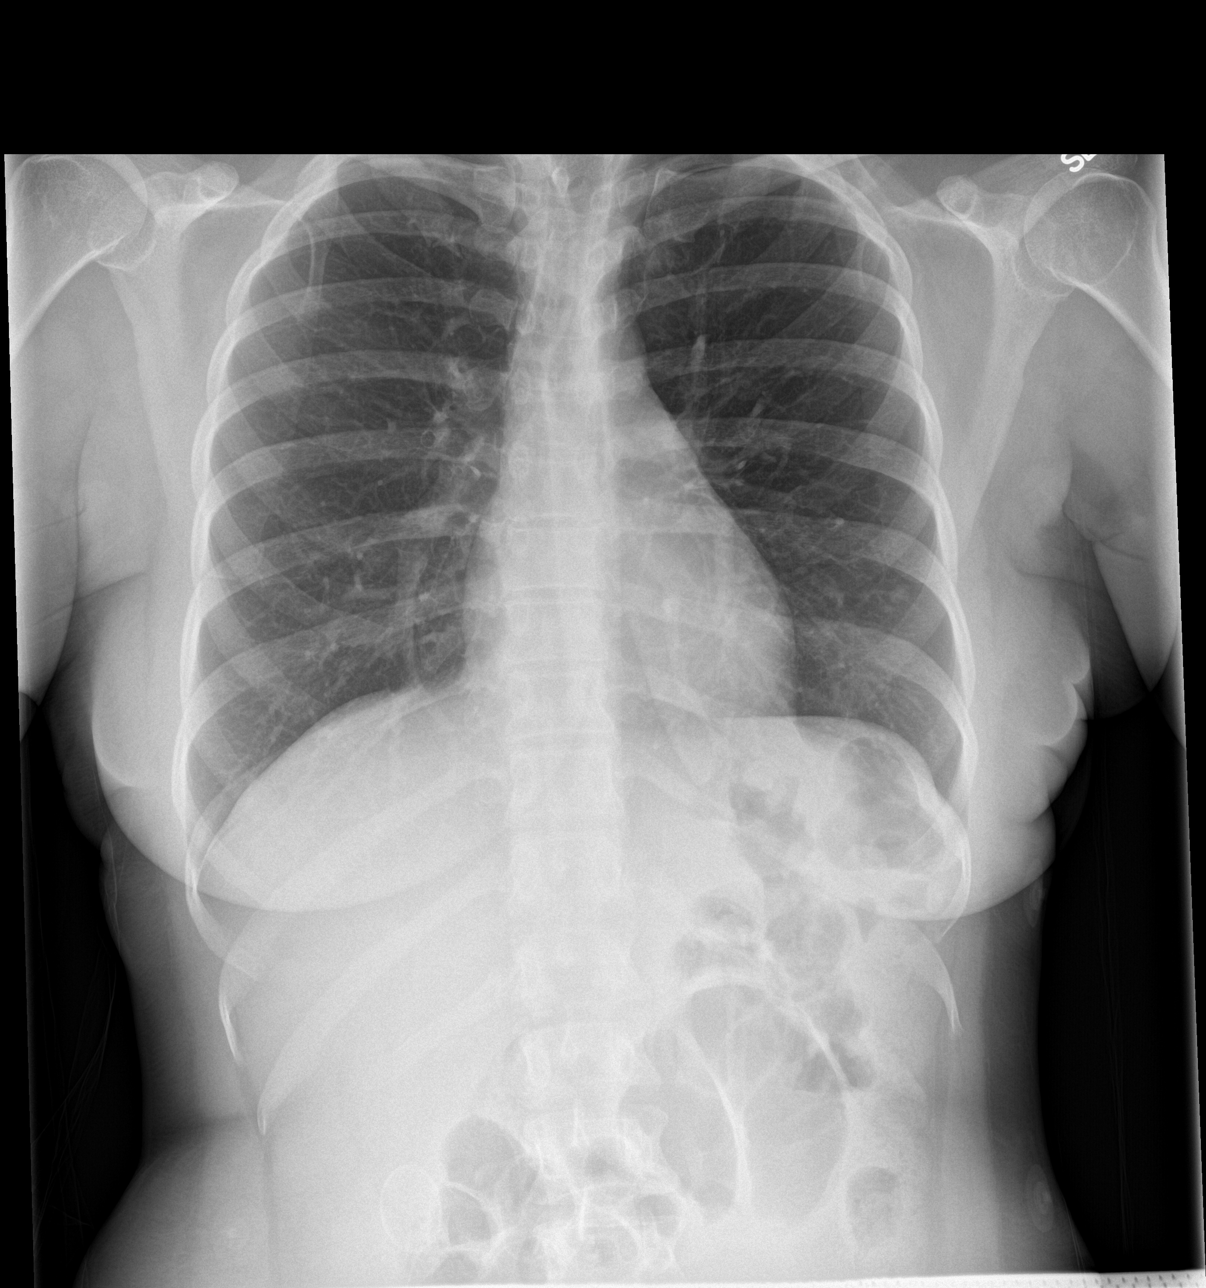

[abdomen erect]
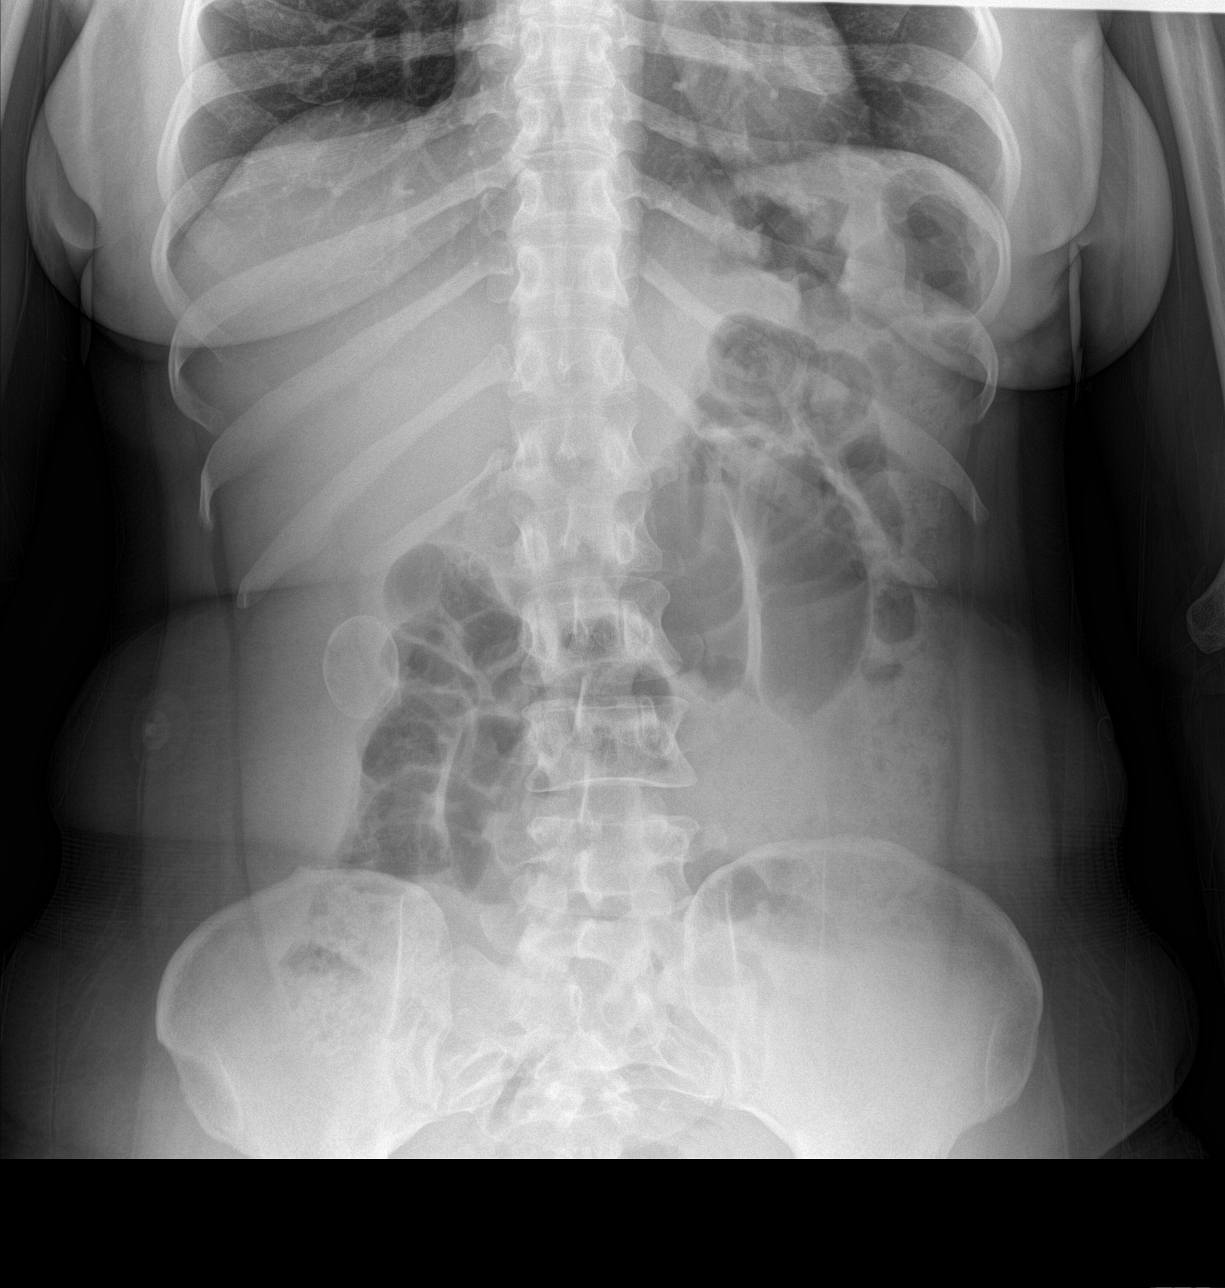

[abdomen supine]
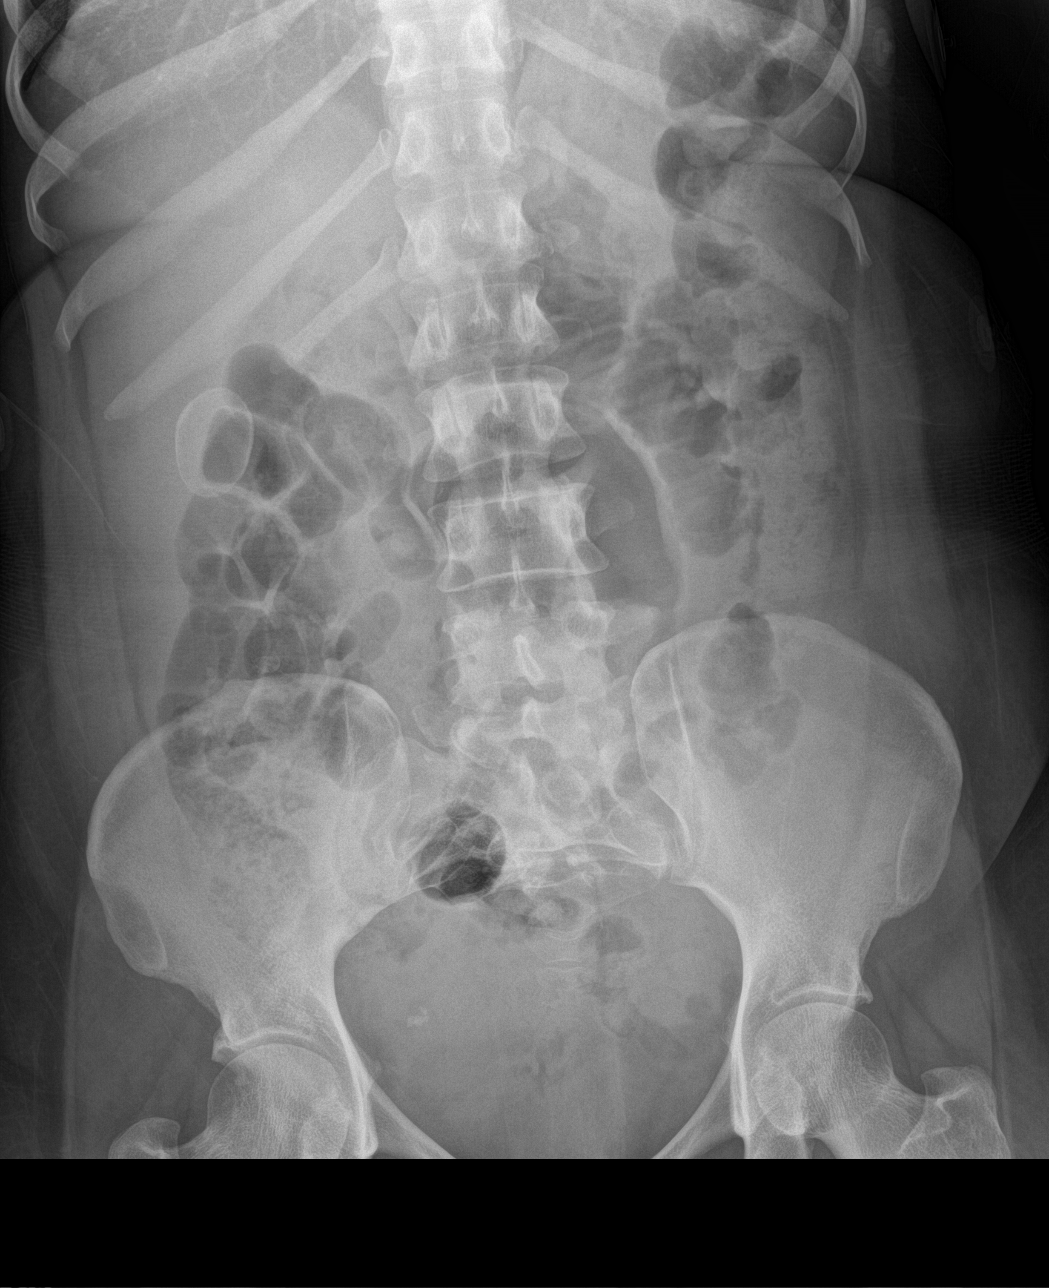

[3 of 3 positions shown; findings below may reference images not displayed]

FINDINGS: The lungs are well-aerated. Apparent scattered calcified granulomata
are seen bilaterally. There is no evidence of focal opacification,
pleural effusion or pneumothorax. The cardiomediastinal silhouette
is within normal limits.

The visualized bowel gas pattern is unremarkable. Scattered stool
and air are seen within the colon; there is no evidence of small
bowel dilatation to suggest obstruction. No free intra-abdominal air
is identified on the provided upright view.

A prominent 3.4 cm rounded calcification at the right mid abdomen
may reflect a large gallstone.

No acute osseous abnormalities are seen; the sacroiliac joints are
unremarkable in appearance.
IMPRESSION: 1. Unremarkable bowel gas pattern; no free intra-abdominal air seen.
Small to moderate amount of stool noted in the colon.
2. No acute cardiopulmonary process seen. Apparent scattered
calcified granulomata noted bilaterally.
3. Possible cholelithiasis incidentally noted.
